# Patient Record
Sex: Male | Born: 1950 | Race: White | Hispanic: No | Marital: Married | Smoking: Never smoker
Health system: Southern US, Community
[De-identification: ages and names within clinical notes are randomized; demographics above are authoritative.]

## PROBLEM LIST (undated history)

## (undated) ENCOUNTER — Ambulatory Visit: Admission: EM | Payer: Medicare Other

## (undated) DIAGNOSIS — M109 Gout, unspecified: Secondary | ICD-10-CM

## (undated) DIAGNOSIS — M1990 Unspecified osteoarthritis, unspecified site: Secondary | ICD-10-CM

## (undated) DIAGNOSIS — I10 Essential (primary) hypertension: Secondary | ICD-10-CM

## (undated) DIAGNOSIS — G4730 Sleep apnea, unspecified: Secondary | ICD-10-CM

## (undated) HISTORY — PX: JOINT REPLACEMENT: SHX530

---

## 1999-05-27 DIAGNOSIS — I252 Old myocardial infarction: Secondary | ICD-10-CM

## 1999-05-27 HISTORY — PX: REPLACEMENT TOTAL KNEE: SUR1224

## 1999-05-27 HISTORY — DX: Old myocardial infarction: I25.2

## 2016-04-09 DIAGNOSIS — Z789 Other specified health status: Secondary | ICD-10-CM | POA: Insufficient documentation

## 2016-04-09 DIAGNOSIS — E785 Hyperlipidemia, unspecified: Secondary | ICD-10-CM | POA: Insufficient documentation

## 2016-04-09 DIAGNOSIS — G479 Sleep disorder, unspecified: Secondary | ICD-10-CM | POA: Insufficient documentation

## 2016-05-12 DIAGNOSIS — Z0000 Encounter for general adult medical examination without abnormal findings: Secondary | ICD-10-CM | POA: Insufficient documentation

## 2016-12-09 DIAGNOSIS — S82899B Other fracture of unspecified lower leg, initial encounter for open fracture type I or II: Secondary | ICD-10-CM | POA: Insufficient documentation

## 2016-12-09 DIAGNOSIS — R5383 Other fatigue: Secondary | ICD-10-CM | POA: Insufficient documentation

## 2017-01-08 DIAGNOSIS — E7800 Pure hypercholesterolemia, unspecified: Secondary | ICD-10-CM | POA: Insufficient documentation

## 2017-01-08 DIAGNOSIS — R001 Bradycardia, unspecified: Secondary | ICD-10-CM | POA: Insufficient documentation

## 2017-01-08 DIAGNOSIS — R011 Cardiac murmur, unspecified: Secondary | ICD-10-CM | POA: Insufficient documentation

## 2017-01-08 DIAGNOSIS — R079 Chest pain, unspecified: Secondary | ICD-10-CM | POA: Insufficient documentation

## 2017-01-08 DIAGNOSIS — I10 Essential (primary) hypertension: Secondary | ICD-10-CM | POA: Insufficient documentation

## 2020-02-16 ENCOUNTER — Ambulatory Visit (INDEPENDENT_AMBULATORY_CARE_PROVIDER_SITE_OTHER): Payer: Medicare Other

## 2020-02-16 ENCOUNTER — Ambulatory Visit
Admission: EM | Admit: 2020-02-16 | Discharge: 2020-02-16 | Disposition: A | Discharge status: Home / Self Care | Payer: Medicare Other

## 2020-02-16 ENCOUNTER — Encounter: Payer: Self-pay | Admitting: Emergency Medicine

## 2020-02-16 ENCOUNTER — Other Ambulatory Visit: Payer: Self-pay

## 2020-02-16 DIAGNOSIS — M25562 Pain in left knee: Secondary | ICD-10-CM | POA: Diagnosis not present

## 2020-02-16 DIAGNOSIS — S86912A Strain of unspecified muscle(s) and tendon(s) at lower leg level, left leg, initial encounter: Secondary | ICD-10-CM

## 2020-02-16 HISTORY — DX: Gout, unspecified: M10.9

## 2020-02-16 HISTORY — DX: Essential (primary) hypertension: I10

## 2020-02-16 NOTE — ED Provider Notes (Signed)
[facility]    CSN: [id number] Arrival date & time: 02/16/20  1443      History   Chief Complaint Chief Complaint  Patient presents with  . Knee Pain    left    HPI Keith Gray is a 69 y.o. male who presents with posterior medial knee pain which started yesterday. He was climbing up steps with items on his hands and when he took a step with his L foot as he lifted felt a pull and pin on area of pain. He iced it and went to bed. While in bed he felt pain when he changed positition from side to side, but ones comfortable, was able to go back to sleep. This am when he got up, he could hardly bare wt due to the pain, so has been using his crutches. Had TKA of the R knee, but his L knee has never been xrayed. Pivoting makes the pain worse as well.     Past Medical History:  Diagnosis Date  . Gout   . Hypertension     There are no problems to display for this patient.   Past Surgical History:  Procedure Laterality Date  . JOINT REPLACEMENT Right    knee       Home Medications    Prior to Admission medications   Medication Sig Start Date End Date Taking? Authorizing Provider  allopurinol (ZYLOPRIM) 300 MG tablet Take 300 mg by mouth daily. 12/22/19  Yes [provider]  aspirin 81 MG EC tablet ASPIRIN EC 81 MG TBEC 04/09/16  Yes [provider]  Cetirizine HCl 10 MG CAPS ZYRTEC ALLERGY 10 MG CAPS 04/09/16  Yes [provider]  losartan (COZAAR) 50 MG tablet Take 50 mg by mouth daily. 12/22/19  Yes [provider]  naproxen (NAPROSYN) 500 MG tablet Take 500 mg by mouth 2 (two) times daily. 11/15/19  Yes [provider]  pravastatin (PRAVACHOL) 40 MG tablet Take 40 mg by mouth daily. 12/22/19  Yes [provider]  traZODone (DESYREL) 50 MG tablet  02/13/20  Yes [provider]    Family History History reviewed. No pertinent family history.  Social History Social History   Tobacco Use  .  Smoking status: Never Smoker  . Smokeless tobacco: Never Used  Vaping Use  . Vaping Use: Never used  Substance Use Topics  . Alcohol use: Not Currently  . Drug use: Not Currently     Allergies   Iodine and Shellfish allergy   Review of Systems Review of Systems  Musculoskeletal: Positive for arthralgias and gait problem. Negative for joint swelling.  Skin: Negative for rash.  Neurological: Negative for numbness.     Physical Exam Triage Vital Signs ED Triage Vitals  Enc Vitals Group     BP 02/16/20 1532 (!) 142/80     Pulse Rate 02/16/20 1532 (!) 54     Resp 02/16/20 1532 18     Temp 02/16/20 1532 98.4 F (36.9 C)     Temp Source 02/16/20 1532 Oral     SpO2 02/16/20 1532 97 %     Weight 02/16/20 1527 250 lb (113.4 kg)     Height 02/16/20 1527 5\' 9"  (1.753 m)     Head Circumference --      Peak Flow --      Pain Score 02/16/20 1527 8     Pain Loc --      Pain Edu? --      Excl.  in GC? --    No data found.  Updated Vital Signs BP (!) 142/80 (BP Location: Right Arm)   Pulse (!) 54   Temp 98.4 F (36.9 C) (Oral)   Resp 18   Ht 5\' 9"  (1.753 m)   Wt 250 lb (113.4 kg)   SpO2 97%   BMI 36.92 kg/m   Visual Acuity Right Eye Distance:   Left Eye Distance:   Bilateral Distance:    Right Eye Near:   Left Eye Near:    Bilateral Near:     Physical Exam Vitals and nursing note reviewed.  Constitutional:      General: He is not in acute distress.    Appearance: He is obese. He is not toxic-appearing.  HENT:     Head: Normocephalic.     Right Ear: External ear normal.     Left Ear: External ear normal.  Eyes:     General: No scleral icterus.    Conjunctiva/sclera: Conjunctivae normal.  Pulmonary:     Effort: Pulmonary effort is normal.  Musculoskeletal:        General: No swelling or deformity.     Cervical back: Neck supple.     Comments: L KNEE- no swelling noted in the front or back of the knee. No ecchymosis or redness. Pain provoked mildly on area  of pain is provoked when testing lateral collateral ligament. No laxity noted.   Skin:    General: Skin is warm and dry.     Findings: No bruising or rash.  Neurological:     Mental Status: He is alert and oriented to person, place, and time.     Motor: No weakness.     Gait: Gait abnormal.  Psychiatric:        Mood and Affect: Mood normal.        Behavior: Behavior normal.        Thought Content: Thought content normal.        Judgment: Judgment normal.    UC Treatments / Results  Labs (all labs ordered are listed, but only abnormal results are displayed) Labs Reviewed - No data to display  EKG   Radiology No results found.  Procedures Procedures (including critical care time)  Medications Ordered in UC Medications - No data to display  Initial Impression / Assessment and Plan / UC Course  I have reviewed the triage vital signs and the nursing notes. He may have strained his meniscus vs tear. He was placed on a knee immobilizer and was able to bare weight better than when he arrived. He may take Advil or motrin prn pain and needs to FU with ortho. See instructions Pertinent  imaging results that were available during my care of the patient were reviewed by me and considered in my medical decision making (see chart for details).   Final Clinical Impressions(s) / UC Diagnoses   Final diagnoses:  None   Discharge Instructions   None    ED Prescriptions    None     PDMP not reviewed this encounter.   , Garey Ham 02/16/20 1703

## 2020-02-16 NOTE — ED Triage Notes (Signed)
Pt c/o left knee pain. Started last night after stepping wrong going up the stairs. He states he felt something pull. He has pain  On the backside of his knee.

## 2020-03-12 ENCOUNTER — Telehealth (INDEPENDENT_AMBULATORY_CARE_PROVIDER_SITE_OTHER): Payer: Self-pay | Admitting: Gastroenterology

## 2020-03-12 DIAGNOSIS — Z1211 Encounter for screening for malignant neoplasm of colon: Secondary | ICD-10-CM

## 2020-03-12 NOTE — Progress Notes (Signed)
Gastroenterology Pre-Procedure Review  Request Date: Friday 04/10/20 Requesting Physician: Dr. Servando Snare  Patient states that he prefers the Miralax, Gatorade, Dulcolax Prep for his colonoscopy.  He has been advised that this is not FDA approved for colonoscopies. Patient has decided to prep with this method due to intolerance of the other bowel preps.  PATIENT REVIEW QUESTIONS: The patient responded to the following health history questions as indicated:    1. Are you having any GI issues? no 2. Do you have a personal history of Polyps? no 3. Do you have a family history of Colon Cancer or Polyps? no 4. Diabetes Mellitus? no 5. Joint replacements in the past 12 months?no 6. Major health problems in the past 3 months?no 7. Any artificial heart valves, MVP, or defibrillator?no    MEDICATIONS & ALLERGIES:    Patient reports the following regarding taking any anticoagulation/antiplatelet therapy:   Plavix, Coumadin, Eliquis, Xarelto, Lovenox, Pradaxa, Brilinta, or Effient? no Aspirin? no  Patient confirms/reports the following medications:  Current Outpatient Medications  Medication Sig Dispense Refill  . allopurinol (ZYLOPRIM) 300 MG tablet Take 300 mg by mouth daily.    Marland Kitchen aspirin 81 MG EC tablet ASPIRIN EC 81 MG TBEC    . Cetirizine HCl 10 MG CAPS ZYRTEC ALLERGY 10 MG CAPS    . Glucosamine Sulfate 500 MG TABS GLUCOSAMINE SULFATE 500 MG TABS    . losartan (COZAAR) 50 MG tablet Take 50 mg by mouth daily.    . meloxicam (MOBIC) 15 MG tablet MOBIC 15 MG TABS    . naproxen (NAPROSYN) 500 MG tablet Take 500 mg by mouth 2 (two) times daily.    . pravastatin (PRAVACHOL) 40 MG tablet Take 40 mg by mouth daily.    . traZODone (DESYREL) 50 MG tablet     . zolpidem (AMBIEN) 10 MG tablet AMBIEN 10 MG TABS     No current facility-administered medications for this visit.    Patient confirms/reports the following allergies:  Allergies  Allergen Reactions  . Iodine Nausea And Vomiting  .  Shellfish Allergy Other (See Comments)    No orders of the defined types were placed in this encounter.   AUTHORIZATION INFORMATION Primary Insurance: 1D#: Group #:  Secondary Insurance: 1D#: Group #:  SCHEDULE INFORMATION: Date: Friday 04/13/20 Time: Location:[facility]

## 2020-04-03 ENCOUNTER — Other Ambulatory Visit: Payer: Self-pay

## 2020-04-03 NOTE — Progress Notes (Signed)
Procedure rescheduled and new instructions have been mailed out.

## 2020-04-25 DIAGNOSIS — U071 COVID-19: Secondary | ICD-10-CM

## 2020-04-25 HISTORY — DX: COVID-19: U07.1

## 2020-04-26 ENCOUNTER — Encounter: Payer: Self-pay | Admitting: Gastroenterology

## 2020-05-02 ENCOUNTER — Other Ambulatory Visit: Admission: RE | Admit: 2020-05-02 | Payer: Medicare Other | Source: Ambulatory Visit

## 2020-05-08 ENCOUNTER — Telehealth: Payer: Self-pay

## 2020-05-08 DIAGNOSIS — Z1211 Encounter for screening for malignant neoplasm of colon: Secondary | ICD-10-CM

## 2020-05-08 NOTE — Telephone Encounter (Signed)
Returned patients call to reschedule his colonoscopy from 06/11/20 [facility] with Dr. Servando Snare to another date.  LVM for him to call the office back to reschedule.  Thanks,  [place], [place]

## 2020-05-14 ENCOUNTER — Other Ambulatory Visit: Payer: Self-pay

## 2020-05-14 MED ORDER — NA SULFATE-K SULFATE-MG SULF 17.5-3.13-1.6 GM/177ML PO SOLN: 1.0000 | Freq: Once | ORAL | 0 refills | Status: AC

## 2020-05-14 NOTE — Progress Notes (Signed)
Patient requested to reschedule procedure. Updated instructions will be sent.

## 2020-06-07 ENCOUNTER — Other Ambulatory Visit: Payer: Self-pay

## 2020-06-07 ENCOUNTER — Encounter: Payer: Self-pay | Admitting: Gastroenterology

## 2020-06-13 ENCOUNTER — Other Ambulatory Visit: Admission: RE | Admit: 2020-06-13 | Payer: Medicare Other | Source: Ambulatory Visit

## 2020-06-14 NOTE — Discharge Instructions (Signed)

## 2020-06-15 ENCOUNTER — Ambulatory Visit: Payer: Medicare Other | Admitting: Anesthesiology

## 2020-06-15 ENCOUNTER — Other Ambulatory Visit: Payer: Self-pay

## 2020-06-15 ENCOUNTER — Ambulatory Visit
Admission: RE | Disposition: A | Discharge status: Home / Self Care | Payer: Self-pay | Source: Home / Self Care | Attending: Gastroenterology

## 2020-06-15 ENCOUNTER — Encounter: Payer: Self-pay | Admitting: Gastroenterology

## 2020-06-15 ENCOUNTER — Ambulatory Visit
Admission: RE | Admit: 2020-06-15 | Discharge: 2020-06-15 | Disposition: A | Discharge status: Home / Self Care | Payer: Medicare Other | Attending: Gastroenterology | Admitting: Gastroenterology

## 2020-06-15 DIAGNOSIS — Z7982 Long term (current) use of aspirin: Secondary | ICD-10-CM | POA: Insufficient documentation

## 2020-06-15 DIAGNOSIS — Z91013 Allergy to seafood: Secondary | ICD-10-CM | POA: Diagnosis not present

## 2020-06-15 DIAGNOSIS — K640 First degree hemorrhoids: Secondary | ICD-10-CM | POA: Diagnosis not present

## 2020-06-15 DIAGNOSIS — K5730 Diverticulosis of large intestine without perforation or abscess without bleeding: Secondary | ICD-10-CM | POA: Insufficient documentation

## 2020-06-15 DIAGNOSIS — Z96651 Presence of right artificial knee joint: Secondary | ICD-10-CM | POA: Insufficient documentation

## 2020-06-15 DIAGNOSIS — Z79899 Other long term (current) drug therapy: Secondary | ICD-10-CM | POA: Diagnosis not present

## 2020-06-15 DIAGNOSIS — Z1211 Encounter for screening for malignant neoplasm of colon: Secondary | ICD-10-CM | POA: Diagnosis not present

## 2020-06-15 DIAGNOSIS — Z791 Long term (current) use of non-steroidal anti-inflammatories (NSAID): Secondary | ICD-10-CM | POA: Insufficient documentation

## 2020-06-15 DIAGNOSIS — Z8616 Personal history of COVID-19: Secondary | ICD-10-CM | POA: Diagnosis not present

## 2020-06-15 DIAGNOSIS — Z888 Allergy status to other drugs, medicaments and biological substances status: Secondary | ICD-10-CM | POA: Insufficient documentation

## 2020-06-15 HISTORY — DX: Unspecified osteoarthritis, unspecified site: M19.90

## 2020-06-15 HISTORY — DX: Sleep apnea, unspecified: G47.30

## 2020-06-15 HISTORY — PX: COLONOSCOPY WITH PROPOFOL: SHX5780

## 2020-06-15 SURGERY — COLONOSCOPY WITH PROPOFOL
Anesthesia: General

## 2020-06-15 MED ORDER — LACTATED RINGERS IV SOLN: INTRAVENOUS | Status: DC | PRN

## 2020-06-15 MED ORDER — LACTATED RINGERS IV SOLN: INTRAVENOUS | Status: DC

## 2020-06-15 MED ORDER — STERILE WATER FOR IRRIGATION IR SOLN
Status: DC | PRN
  Administered 2020-06-15: 150 mL

## 2020-06-15 MED ORDER — LIDOCAINE HCL (CARDIAC) PF 100 MG/5ML IV SOSY
PREFILLED_SYRINGE | INTRAVENOUS | Status: DC | PRN
  Administered 2020-06-15: 50 mg via INTRAVENOUS

## 2020-06-15 MED ORDER — PROPOFOL 10 MG/ML IV BOLUS
INTRAVENOUS | Status: DC | PRN
  Administered 2020-06-15: 170 mg via INTRAVENOUS
  Administered 2020-06-15: 30 mg via INTRAVENOUS
  Administered 2020-06-15 (×2): 40 mg via INTRAVENOUS

## 2020-06-15 MED ORDER — SODIUM CHLORIDE 0.9 % IV SOLN: INTRAVENOUS | Status: DC

## 2020-06-15 SURGICAL SUPPLY — 25 items
CLIP HMST 235XBRD CATH ROT (MISCELLANEOUS) IMPLANT
CLIP RESOLUTION 360 11X235 (MISCELLANEOUS)
ELECT REM PT RETURN 9FT ADLT (ELECTROSURGICAL)
ELECTRODE REM PT RTRN 9FT ADLT (ELECTROSURGICAL) IMPLANT
FCP ESCP3.2XJMB 240X2.8X (MISCELLANEOUS)
FORCEPS BIOP RAD 4 LRG CAP 4 (CUTTING FORCEPS) IMPLANT
FORCEPS BIOP RJ4 240 W/NDL (MISCELLANEOUS)
FORCEPS ESCP3.2XJMB 240X2.8X (MISCELLANEOUS) IMPLANT
GOWN CVR UNV OPN BCK APRN NK (MISCELLANEOUS) ×2 IMPLANT
GOWN ISOL THUMB LOOP REG UNIV (MISCELLANEOUS) ×4
INJECTOR VARIJECT VIN23 (MISCELLANEOUS) IMPLANT
KIT DEFENDO VALVE AND CONN (KITS) IMPLANT
KIT PRC NS LF DISP ENDO (KITS) ×1 IMPLANT
KIT PROCEDURE OLYMPUS (KITS) ×2
MANIFOLD NEPTUNE II (INSTRUMENTS) ×2 IMPLANT
MARKER SPOT ENDO TATTOO 5ML (MISCELLANEOUS) IMPLANT
PROBE APC STR FIRE (PROBE) IMPLANT
RETRIEVER NET ROTH 2.5X230 LF (MISCELLANEOUS) IMPLANT
SNARE SHORT THROW 13M SML OVAL (MISCELLANEOUS) IMPLANT
SNARE SHORT THROW 30M LRG OVAL (MISCELLANEOUS) IMPLANT
SNARE SNG USE RND 15MM (INSTRUMENTS) IMPLANT
SPOT EX ENDOSCOPIC TATTOO (MISCELLANEOUS)
TRAP ETRAP POLY (MISCELLANEOUS) IMPLANT
VARIJECT INJECTOR VIN23 (MISCELLANEOUS)
WATER STERILE IRR 250ML POUR (IV SOLUTION) ×2 IMPLANT

## 2020-06-15 NOTE — Op Note (Signed)
[facility] Gastroenterology Patient Name: Keith Gray Procedure Date: 06/15/2020 9:00 AM MRN: [id number] Account #: [id number] Date of Birth: [birth year] Admit Type: Outpatient Age: 70 Room: [facility] OR ROOM 01 Gender: Male Note Status: Finalized Procedure:             Colonoscopy Indications:           Screening for colorectal malignant neoplasm Providers:             [name] MD, MD Referring MD:          Presley Raddle Revelo (Referring MD) Medicines:             Propofol per Anesthesia Complications:         No immediate complications. Procedure:             Pre-Anesthesia Assessment:                        - Prior to the procedure, a History and Physical was                         performed, and patient medications and allergies were                         reviewed. The patient's tolerance of previous                         anesthesia was also reviewed. The risks and benefits                         of the procedure and the sedation options and risks                         were discussed with the patient. All questions were                         answered, and informed consent was obtained. Prior                         Anticoagulants: The patient has taken no previous                         anticoagulant or antiplatelet agents. ASA Grade                         Assessment: II - A patient with mild systemic disease.                         After reviewing the risks and benefits, the patient                         was deemed in satisfactory condition to undergo the                         procedure.                        After obtaining informed consent, the colonoscope was  passed under direct vision. Throughout the procedure,                         the patient's blood pressure, pulse, and oxygen                         saturations were monitored continuously. The                         Colonoscope was introduced through  the anus and                         advanced to the the cecum, identified by appendiceal                         orifice and ileocecal valve. The colonoscopy was                         performed without difficulty. The patient tolerated                         the procedure well. The quality of the bowel                         preparation was excellent. Findings:      The perianal and digital rectal examinations were normal.      Multiple small-mouthed diverticula were found in the sigmoid colon.      Non-bleeding internal hemorrhoids were found during retroflexion. The       hemorrhoids were Grade II (internal hemorrhoids that prolapse but reduce       spontaneously). Impression:            - Diverticulosis in the sigmoid colon.                        - Non-bleeding internal hemorrhoids.                        - No specimens collected. Recommendation:        - Discharge patient to home.                        - Resume previous diet.                        - Continue present medications.                        - No repeat colonoscopy due to current age (24 years                         or older).                        - unless any change in family history or lower GI                         problems. Procedure Code(s):     --- Professional ---  38177, Colonoscopy, flexible; diagnostic, including                         collection of specimen(s) by brushing or washing, when                         performed (separate procedure) Diagnosis Code(s):     --- Professional ---                        Z12.11, Encounter for screening for malignant neoplasm                         of colon CPT copyright 2019 American Medical Association. All rights reserved. The codes documented in this report are preliminary and upon coder review may  be revised to meet current compliance requirements. [name] MD, MD 06/15/2020 9:22:06 AM This report has been signed  electronically. Number of Addenda: 0 Note Initiated On: 06/15/2020 9:00 AM Scope Withdrawal Time: 0 hours 7 minutes 13 seconds  Total Procedure Duration: 0 hours 11 minutes 4 seconds  Estimated Blood Loss:  Estimated blood loss: none.      [facility]

## 2020-06-15 NOTE — Anesthesia Preprocedure Evaluation (Signed)
Anesthesia Evaluation  Patient identified by MRN, date of birth, ID band Patient awake    Reviewed: Allergy & Precautions, H&P , NPO status , Patient's Chart, lab work & pertinent test results  Airway Mallampati: II  TM Distance: >3 FB Neck ROM: full    Dental  (+) Upper Dentures   Pulmonary sleep apnea and Continuous Positive Airway Pressure Ventilation ,    Pulmonary exam normal breath sounds clear to auscultation       Cardiovascular hypertension, + Past MI  Normal cardiovascular exam Rhythm:regular Rate:Normal     Neuro/Psych    GI/Hepatic   Endo/Other    Renal/GU      Musculoskeletal   Abdominal   Peds  Hematology   Anesthesia Other Findings   Reproductive/Obstetrics                             Anesthesia Physical Anesthesia Plan  ASA: III  Anesthesia Plan: General   Post-op Pain Management:    Induction: Intravenous  PONV Risk Score and Plan: 2 and Treatment may vary due to age or medical condition, TIVA and Propofol infusion  Airway Management Planned: Natural Airway  Additional Equipment:   Intra-op Plan:   Post-operative Plan:   Informed Consent: I have reviewed the patients History and Physical, chart, labs and discussed the procedure including the risks, benefits and alternatives for the proposed anesthesia with the patient or authorized representative who has indicated his/her understanding and acceptance.     Dental Advisory Given  Plan Discussed with: CRNA  Anesthesia Plan Comments:         Anesthesia Quick Evaluation

## 2020-06-15 NOTE — H&P (Signed)
[name], MD [facility] [address], [address] [place], [place] [address] Phone: [phone] Fax : [phone]  Primary Care Physician:  Preston Fleeting, MD Primary Gastroenterologist:  Dr. Servando Snare  Pre-Procedure History & Physical: HPI:  Keith Gray is a 70 y.o. male is here for a screening colonoscopy.   Past Medical History:  Diagnosis Date  . Arthritis    left knee; left shoulder  . COVID-19 04/25/2020  . Gout   . Hypertension   . Myocardial infarct, old 2001  . Sleep apnea     Past Surgical History:  Procedure Laterality Date  . JOINT REPLACEMENT Right    knee  . REPLACEMENT TOTAL KNEE Right 2001    Prior to Admission medications   Medication Sig Start Date End Date Taking? Authorizing Provider  allopurinol (ZYLOPRIM) 300 MG tablet Take 300 mg by mouth daily. 12/22/19  Yes [provider]  aspirin 81 MG EC tablet ASPIRIN EC 81 MG TBEC 04/09/16  Yes [provider]  Cetirizine HCl 10 MG CAPS ZYRTEC ALLERGY 10 MG CAPS 04/09/16  Yes [provider]  Famotidine (PEPCID PO) Take by mouth daily.   Yes [provider]  Glucosamine Sulfate 500 MG TABS GLUCOSAMINE SULFATE 500 MG TABS 04/09/16  Yes [provider]  losartan (COZAAR) 50 MG tablet Take 50 mg by mouth daily. 12/22/19  Yes [provider]  naproxen (NAPROSYN) 500 MG tablet Take 500 mg by mouth 2 (two) times daily. 11/15/19  Yes [provider]  Omega-3 Fatty Acids (FISH OIL PO) Take by mouth daily.   Yes [provider]  pravastatin (PRAVACHOL) 40 MG tablet Take 40 mg by mouth daily. 12/22/19  Yes [provider]  traZODone (DESYREL) 50 MG tablet  02/13/20  Yes [provider]  VITAMIN D PO Take by mouth daily.   Yes [provider]    Allergies as of 03/12/2020 - Review Complete 03/12/2020  Allergen Reaction Noted  . Iodine Nausea And Vomiting 05/12/2016  . Shellfish allergy Other (See Comments) 05/12/2016     History reviewed. No pertinent family history.  Social History   Socioeconomic History  . Marital status: Married    Spouse name: Not on file  . Number of children: Not on file  . Years of education: Not on file  . Highest education level: Not on file  Occupational History  . Not on file  Tobacco Use  . Smoking status: Never Smoker  . Smokeless tobacco: Never Used  Vaping Use  . Vaping Use: Never used  Substance and Sexual Activity  . Alcohol use: Not Currently  . Drug use: Not Currently  . Sexual activity: Not on file  Other Topics Concern  . Not on file  Social History Narrative  . Not on file   Social Determinants of Health   Financial Resource Strain: Not on file  Food Insecurity: Not on file  Transportation Needs: Not on file  Physical Activity: Not on file  Stress: Not on file  Social Connections: Not on file  Intimate Partner Violence: Not on file    Review of Systems: See HPI, otherwise negative ROS  Physical Exam: BP (!) 140/101   Pulse 68   Temp (!) 97 F (36.1 C) (Temporal)   Ht 5\' 9"  (1.753 m)   Wt 117 kg   SpO2 97%   BMI 38.10 kg/m  General:   Alert,  pleasant and cooperative in NAD Head:  Normocephalic and atraumatic. Neck:  Supple; no  masses or thyromegaly. Lungs:  Clear throughout to auscultation.    Heart:  Regular rate and rhythm. Abdomen:  Soft, nontender and nondistended. Normal bowel sounds, without guarding, and without rebound.   Neurologic:  Alert and  oriented x4;  grossly normal neurologically.  Impression/Plan: [name] is now here to undergo a screening colonoscopy.  Risks, benefits, and alternatives regarding colonoscopy have been reviewed with the patient.  Questions have been answered.  All parties agreeable.

## 2020-06-15 NOTE — Anesthesia Postprocedure Evaluation (Signed)
Anesthesia Post Note  Patient: Keith Gray  Procedure(s) Performed: COLONOSCOPY WITH PROPOFOL (N/A )     Patient location during evaluation: PACU Anesthesia Type: General Level of consciousness: awake and alert and oriented Pain management: satisfactory to patient Vital Signs Assessment: post-procedure vital signs reviewed and stable Respiratory status: spontaneous breathing, nonlabored ventilation and respiratory function stable Cardiovascular status: blood pressure returned to baseline and stable Postop Assessment: Adequate PO intake and No signs of nausea or vomiting Anesthetic complications: no   No complications documented.  [place]

## 2020-06-15 NOTE — Transfer of Care (Signed)
Immediate Anesthesia Transfer of Care Note  Patient: Keith Gray  Procedure(s) Performed: COLONOSCOPY WITH PROPOFOL (N/A )  Patient Location: PACU  Anesthesia Type: General  Level of Consciousness: awake, alert  and patient cooperative  Airway and Oxygen Therapy: Patient Spontanous Breathing and Patient connected to supplemental oxygen  Post-op Assessment: Post-op Vital signs reviewed, Patient's Cardiovascular Status Stable, Respiratory Function Stable, Patent Airway and No signs of Nausea or vomiting  Post-op Vital Signs: Reviewed and stable  Complications: No complications documented.

## 2020-06-15 NOTE — Anesthesia Procedure Notes (Signed)
Date/Time: 06/15/2020 9:06 AM Performed by: Maree Krabbe, CRNA Pre-anesthesia Checklist: Patient identified, Emergency Drugs available, Suction available, Timeout performed and Patient being monitored Patient Re-evaluated:Patient Re-evaluated prior to induction Oxygen Delivery Method: Nasal cannula Placement Confirmation: positive ETCO2

## 2020-06-18 ENCOUNTER — Encounter: Payer: Self-pay | Admitting: Gastroenterology

## 2020-06-27 ENCOUNTER — Ambulatory Visit: Payer: Medicare Other | Admitting: Dermatology

## 2020-08-16 ENCOUNTER — Ambulatory Visit: Payer: Medicare Other | Admitting: Dermatology

## 2020-11-08 ENCOUNTER — Emergency Department
Admission: EM | Admit: 2020-11-08 | Discharge: 2020-11-08 | Disposition: A | Discharge status: Home / Self Care | Payer: Medicare Other | Attending: Emergency Medicine | Admitting: Emergency Medicine

## 2020-11-08 ENCOUNTER — Other Ambulatory Visit: Payer: Self-pay

## 2020-11-08 DIAGNOSIS — I10 Essential (primary) hypertension: Secondary | ICD-10-CM | POA: Diagnosis not present

## 2020-11-08 DIAGNOSIS — S41151A Open bite of right upper arm, initial encounter: Secondary | ICD-10-CM | POA: Diagnosis present

## 2020-11-08 DIAGNOSIS — Z79899 Other long term (current) drug therapy: Secondary | ICD-10-CM | POA: Diagnosis not present

## 2020-11-08 DIAGNOSIS — Z8616 Personal history of COVID-19: Secondary | ICD-10-CM | POA: Insufficient documentation

## 2020-11-08 DIAGNOSIS — Z96651 Presence of right artificial knee joint: Secondary | ICD-10-CM | POA: Insufficient documentation

## 2020-11-08 DIAGNOSIS — W540XXA Bitten by dog, initial encounter: Secondary | ICD-10-CM | POA: Insufficient documentation

## 2020-11-08 DIAGNOSIS — Z7982 Long term (current) use of aspirin: Secondary | ICD-10-CM | POA: Diagnosis not present

## 2020-11-08 MED ORDER — TRAMADOL HCL 50 MG PO TABS
50.0000 mg | ORAL_TABLET | Freq: Once | ORAL | Status: AC
  Administered 2020-11-08: 50 mg via ORAL
  Filled 2020-11-08: qty 1

## 2020-11-08 MED ORDER — AMOXICILLIN-POT CLAVULANATE 875-125 MG PO TABS: 1.0000 | ORAL_TABLET | Freq: Two times a day (BID) | ORAL | 0 refills | Status: AC

## 2020-11-08 MED ORDER — NAPROXEN 500 MG PO TABS
500.0000 mg | ORAL_TABLET | Freq: Once | ORAL | Status: AC
  Administered 2020-11-08: 500 mg via ORAL
  Filled 2020-11-08: qty 1

## 2020-11-08 MED ORDER — NAPROXEN 500 MG PO TABS: 500.0000 mg | ORAL_TABLET | Freq: Two times a day (BID) | ORAL | Status: AC

## 2020-11-08 MED ORDER — AMOXICILLIN-POT CLAVULANATE 875-125 MG PO TABS
1.0000 | ORAL_TABLET | Freq: Once | ORAL | Status: AC
  Administered 2020-11-08: 1 via ORAL
  Filled 2020-11-08: qty 1

## 2020-11-08 NOTE — ED Notes (Signed)
This RN spoke with Sgt F[place]e [place] [employer]

## 2020-11-08 NOTE — ED Provider Notes (Signed)
[facility] Emergency Department Provider Note   ____________________________________________   Event Date/Time   First MD Initiated Contact with Patient 11/08/20 1355     (approximate)  I have reviewed the triage vital signs and the nursing notes.   HISTORY  Chief Complaint Animal Bite    HPI Keith Gray is a 70 y.o. male patient presents with dog bite to the right upper arm.  Wounds are superficial.  Patient states wife is verifying immunization status of the animal.  Patient tetanus shot is up-to-date.  Rates pain as a 5/10.  Described pain as "sore".  No palliative measure prior to arrival.     Past Medical History:  Diagnosis Date   Arthritis    left knee; left shoulder   COVID-19 04/25/2020   Gout    Hypertension    Myocardial infarct, old 2001   Sleep apnea     Patient Active Problem List   Diagnosis Date Noted   Encounter for screening colonoscopy    Bradycardia 01/08/2017   Chest pain with high risk for cardiac etiology 01/08/2017   Essential hypertension 01/08/2017   Heart murmur, systolic 01/08/2017   Pure hypercholesterolemia 01/08/2017   Fatigue 12/09/2016   Open fracture of ankle 12/09/2016   Encounter for general adult medical examination without abnormal findings 05/12/2016   Hyperlipidemia 04/09/2016   Other specified health status 04/09/2016   Sleep disorder 04/09/2016    Past Surgical History:  Procedure Laterality Date   COLONOSCOPY WITH PROPOFOL N/A 06/15/2020   Procedure: COLONOSCOPY WITH PROPOFOL;  Surgeon: [name], MD;  Location: [facility];  Service: Endoscopy;  Laterality: N/A;  COVID (+) 12/1-21 priority 4   JOINT REPLACEMENT Right    knee   REPLACEMENT TOTAL KNEE Right 2001    Prior to Admission medications   Medication Sig Start Date End Date Taking? Authorizing Provider  amoxicillin-clavulanate (AUGMENTIN) 875-125 MG tablet Take 1 tablet by mouth 2 (two) times daily for 10 days.  11/08/20 11/18/20 Yes Joni Reining, PA-C  naproxen (NAPROSYN) 500 MG tablet Take 1 tablet (500 mg total) by mouth 2 (two) times daily with a meal. 11/08/20  Yes Joni Reining, PA-C  allopurinol (ZYLOPRIM) 300 MG tablet Take 300 mg by mouth daily. 12/22/19   [provider]  aspirin 81 MG EC tablet ASPIRIN EC 81 MG TBEC 04/09/16   [provider]  Cetirizine HCl 10 MG CAPS ZYRTEC ALLERGY 10 MG CAPS 04/09/16   [provider]  Famotidine (PEPCID PO) Take by mouth daily.    [provider]  Glucosamine Sulfate 500 MG TABS GLUCOSAMINE SULFATE 500 MG TABS 04/09/16   [provider]  losartan (COZAAR) 50 MG tablet Take 50 mg by mouth daily. 12/22/19   [provider]  naproxen (NAPROSYN) 500 MG tablet Take 500 mg by mouth 2 (two) times daily. 11/15/19   [provider]  Omega-3 Fatty Acids (FISH OIL PO) Take by mouth daily.    [provider]  pravastatin (PRAVACHOL) 40 MG tablet Take 40 mg by mouth daily. 12/22/19   [provider]  traZODone (DESYREL) 50 MG tablet  02/13/20   [provider]  VITAMIN D PO Take by mouth daily.    [provider]    Allergies Iodine and Shellfish allergy  No family history on file.  Social History Social History   Tobacco Use   Smoking status: Never   Smokeless tobacco: Never  Vaping Use   Vaping Use:  Never used  Substance Use Topics   Alcohol use: Not Currently   Drug use: Not Currently    Review of Systems  Constitutional: No fever/chills Eyes: No visual changes. ENT: No sore throat. Cardiovascular: Denies chest pain. Respiratory: Denies shortness of breath. Gastrointestinal: No abdominal pain.  No nausea, no vomiting.  No diarrhea.  No constipation. Genitourinary: Negative for dysuria. Musculoskeletal: Negative for back pain. Skin: Negative for rash. Neurological: Negative for headaches, focal weakness or numbness. Endocrine: Gout, hyperlipidemia,  and hypertension. Hematological/Lymphatic:  Allergic/Immunilogical: Lodine and shellfish  ____________________________________________   PHYSICAL EXAM:  VITAL SIGNS: ED Triage Vitals [11/08/20 1329]  Enc Vitals Group     BP (!) 156/98     Pulse Rate 62     Resp 18     Temp 98.2 F (36.8 C)     Temp Source Oral     SpO2 95 %     Weight 260 lb (117.9 kg)     Height 5\' 9"  (1.753 m)     Head Circumference      Peak Flow      Pain Score 5     Pain Loc      Pain Edu?      Excl. in GC?     Constitutional: Alert and oriented. Well appearing and in no acute distress. Cardiovascular: Normal rate, regular rhythm. Grossly normal heart sounds.  Good peripheral circulation.  Elevated blood pressure. Respiratory: Normal respiratory effort.  No retractions. Lungs CTAB. Musculoskeletal: No lower extremity tenderness nor edema.  No joint effusions. Neurologic:  Normal speech and language. No gross focal neurologic deficits are appreciated. No gait instability. Skin: Facial dog bite she is the right upper arm.   Psychiatric: Mood and affect are normal. Speech and behavior are normal.  ____________________________________________   LABS (all labs ordered are listed, but only abnormal results are displayed)  Labs Reviewed - No data to display ____________________________________________  EKG   ____________________________________________  RADIOLOGY I, , personally viewed and evaluated these images (plain radiographs) as part of my medical decision making, as well as reviewing the written report by the radiologist.  ED MD interpretation:    Official radiology report(s): No results found.  ____________________________________________   PROCEDURES  Procedure(s) performed (including Critical Care):  Procedures   ____________________________________________   INITIAL IMPRESSION / ASSESSMENT AND PLAN / ED COURSE  As part of my medical decision making, I  reviewed the following data within the electronic MEDICAL RECORD NUMBER         Patient presents with superficial dog bite to the right upper arm.  Area was cleaned and bandaged.  Patient given discharge care instruction.  Patient given a prescription for Augmentin and naproxen.  We will follow-up pending immunization status of dog.      ____________________________________________   FINAL CLINICAL IMPRESSION(S) / ED DIAGNOSES  Final diagnoses:  Dog bite, initial encounter     ED Discharge Orders          Ordered    amoxicillin-clavulanate (AUGMENTIN) 875-125 MG tablet  2 times daily        11/08/20 1419    naproxen (NAPROSYN) 500 MG tablet  2 times daily with meals        11/08/20 1419             Note:  This document was prepared using Dragon voice recognition software and may include unintentional dictation errors.    11/10/20, PA-C 11/08/20 1422    11/10/20,  Leonette Most, MD 11/08/20 1529

## 2020-11-08 NOTE — ED Triage Notes (Signed)
Pt to ED for dog bite today. Bite mark noted to right upper arm. Bleeding controlled Unsure of last tetanus States he believes dogs shots were UTD, waiting on wife to get owner info

## 2020-11-08 NOTE — ED Notes (Signed)
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

## 2020-11-08 NOTE — Discharge Instructions (Addendum)
Read and follow discharge care instruction.  Verify immunization status of dog.  Return brain to ED if rabies shots are needed.

## 2021-06-20 ENCOUNTER — Other Ambulatory Visit: Payer: Self-pay | Admitting: Physician Assistant

## 2021-07-05 IMAGING — CR DG KNEE COMPLETE 4+V*L*
4 series · 4 of 4 positions shown · non-contrast
Comparison: None.

CLINICAL DATA: Posteromedial knee pain.

EXAM:
LEFT KNEE - COMPLETE 4+ VIEW

[knee ap]
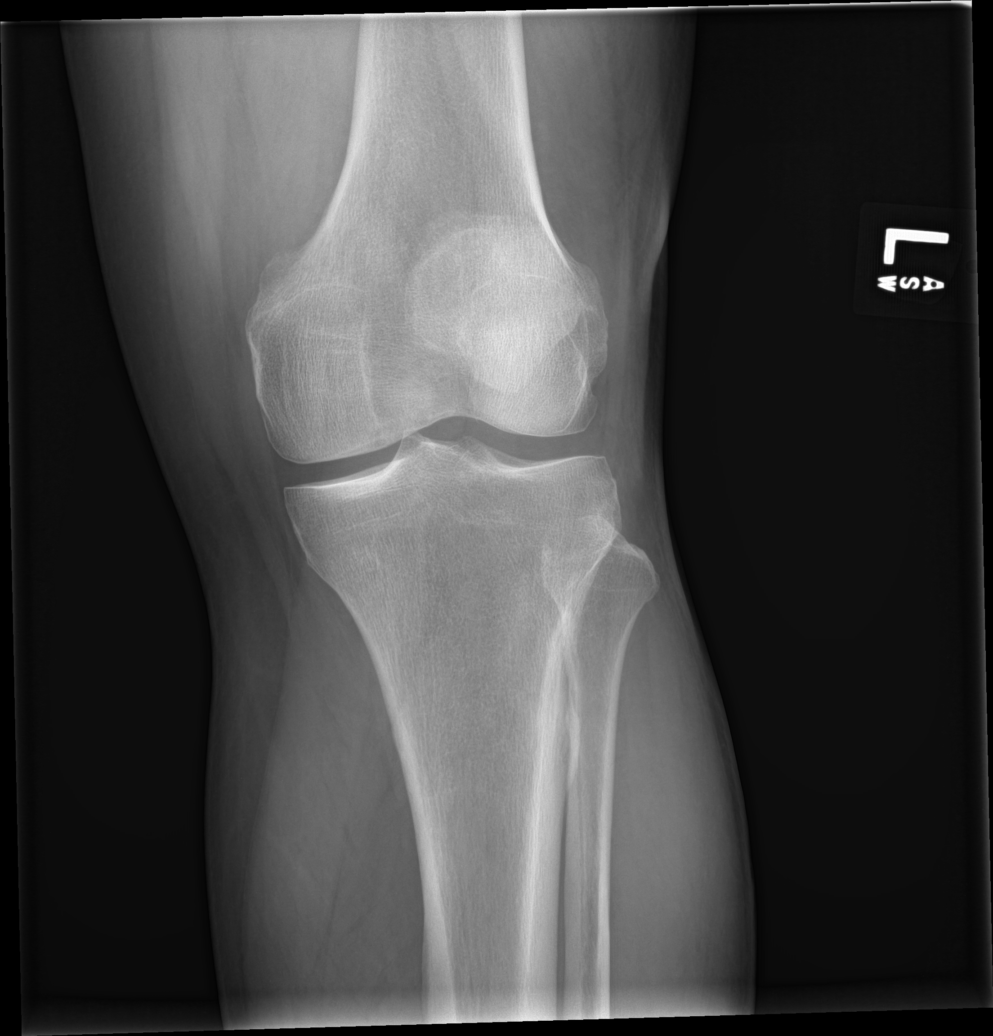

[knee lat]
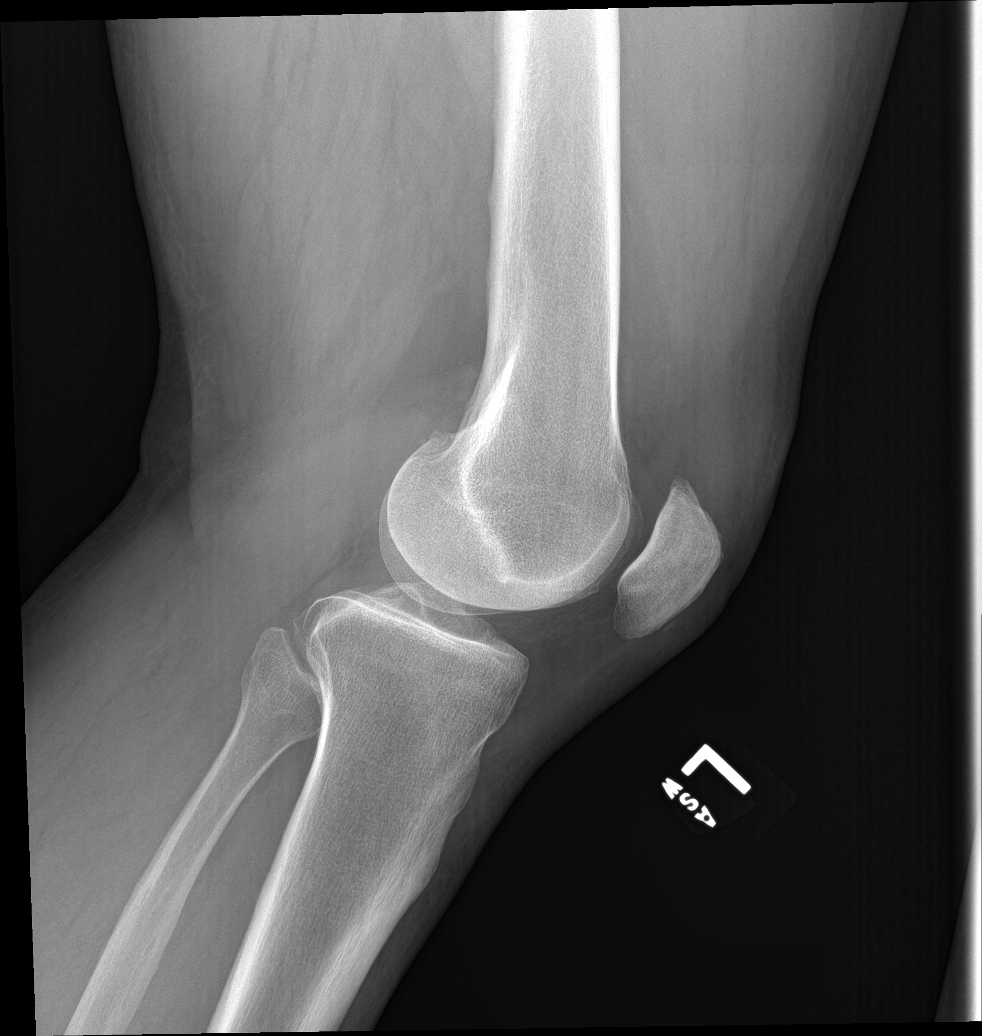

[knee obl (1 of 2)]
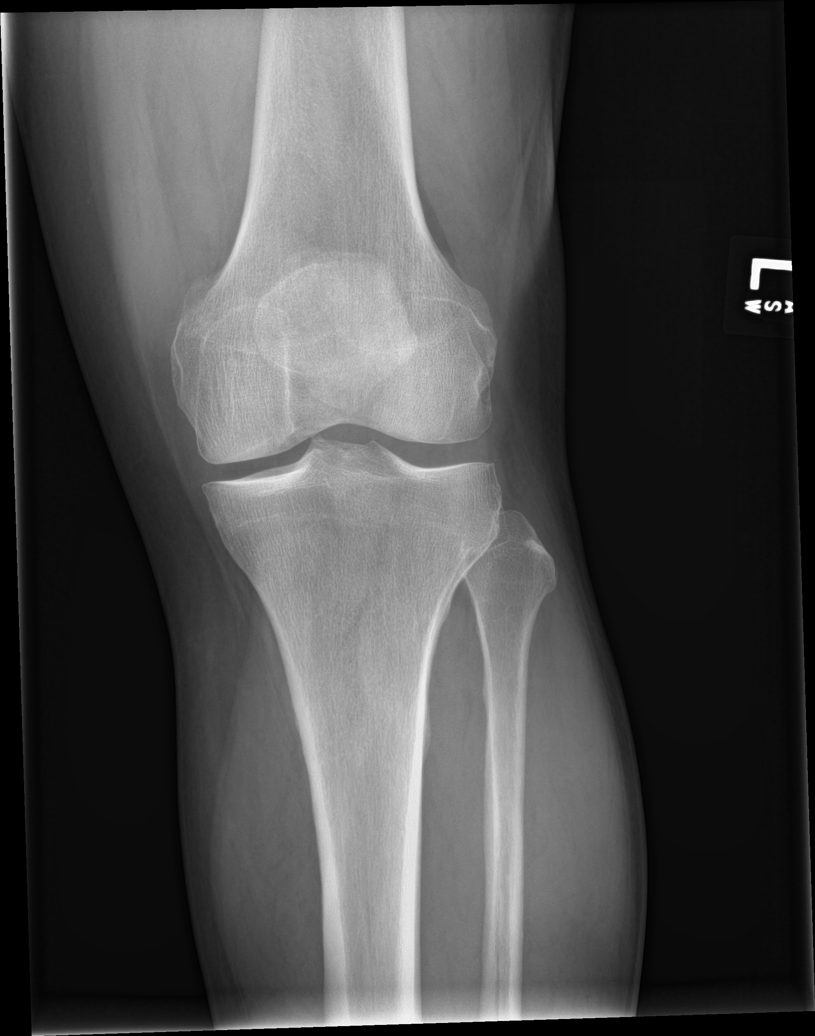

[knee obl (2 of 2)]
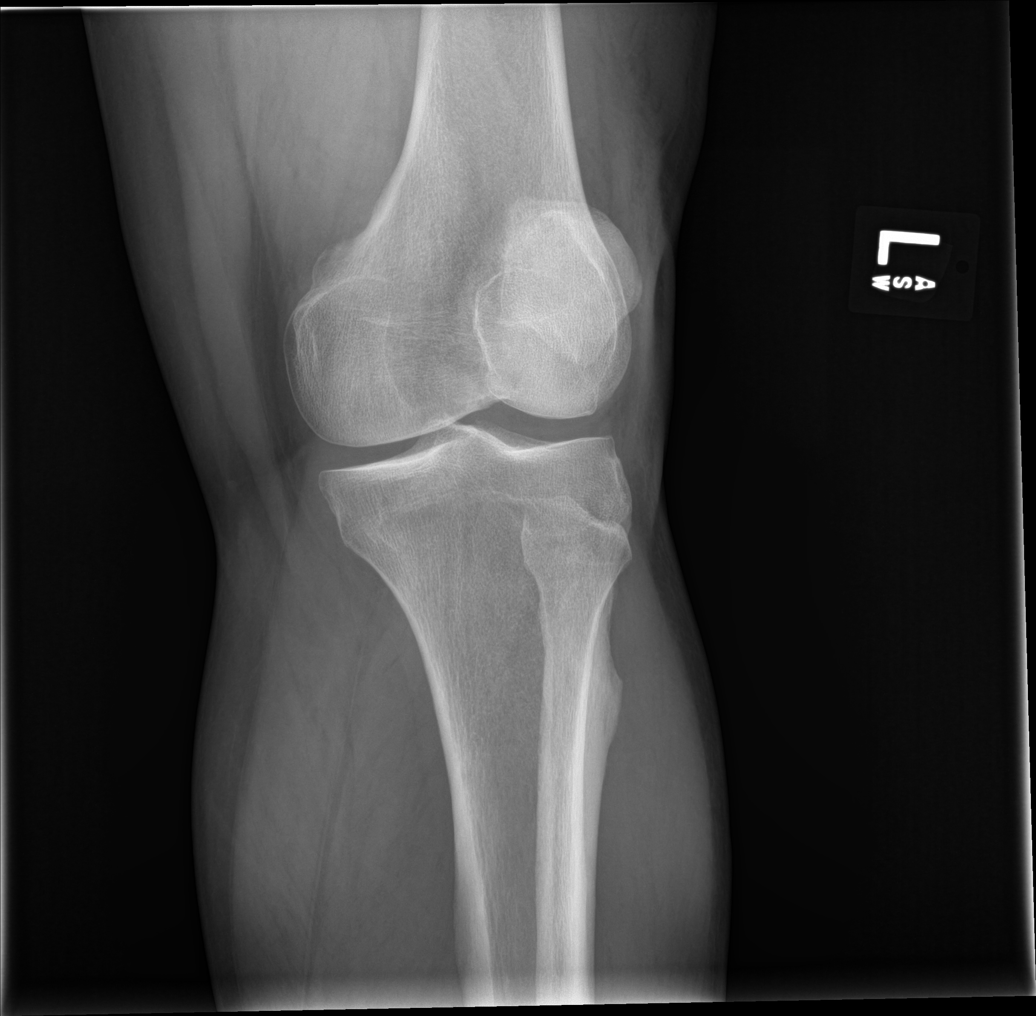

[4 of 4 positions shown; findings below may reference images not displayed]

FINDINGS: No evidence of fracture, dislocation, or joint effusion. No evidence
of arthropathy or other focal bone abnormality. Soft tissues are
unremarkable.
IMPRESSION: Negative.

## 2022-01-30 ENCOUNTER — Other Ambulatory Visit: Payer: Self-pay | Admitting: Student

## 2022-01-30 DIAGNOSIS — M25562 Pain in left knee: Secondary | ICD-10-CM

## 2023-12-16 ENCOUNTER — Ambulatory Visit
Admission: EM | Admit: 2023-12-16 | Discharge: 2023-12-16 | Disposition: A | Discharge status: Home / Self Care | Attending: Family Medicine | Admitting: Family Medicine

## 2023-12-16 ENCOUNTER — Encounter: Payer: Self-pay | Admitting: Emergency Medicine

## 2023-12-16 DIAGNOSIS — H5789 Other specified disorders of eye and adnexa: Secondary | ICD-10-CM | POA: Diagnosis not present

## 2023-12-16 DIAGNOSIS — T63461A Toxic effect of venom of wasps, accidental (unintentional), initial encounter: Secondary | ICD-10-CM | POA: Diagnosis not present

## 2023-12-16 DIAGNOSIS — R220 Localized swelling, mass and lump, head: Secondary | ICD-10-CM | POA: Diagnosis not present

## 2023-12-16 MED ORDER — DEXAMETHASONE SODIUM PHOSPHATE 10 MG/ML IJ SOLN
10.0000 mg | Freq: Once | INTRAMUSCULAR | Status: AC
  Administered 2023-12-16: 10 mg via INTRAMUSCULAR

## 2023-12-16 MED ORDER — PREDNISONE 10 MG (21) PO TBPK: ORAL_TABLET | Freq: Every day | ORAL | 0 refills | Status: DC

## 2023-12-16 MED ORDER — DIPHENHYDRAMINE HCL 25 MG PO CAPS
25.0000 mg | ORAL_CAPSULE | Freq: Once | ORAL | Status: AC
  Administered 2023-12-16: 25 mg via ORAL

## 2023-12-16 MED ORDER — TRIAMCINOLONE ACETONIDE 0.1 % EX OINT: 1.0000 | TOPICAL_OINTMENT | Freq: Two times a day (BID) | CUTANEOUS | 0 refills | Status: DC

## 2023-12-16 MED ORDER — CETIRIZINE HCL 10 MG PO TABS: 10.0000 mg | ORAL_TABLET | Freq: Two times a day (BID) | ORAL | Status: AC | PRN

## 2023-12-16 NOTE — ED Provider Notes (Incomplete)
 [facility]    CSN: [id number] Arrival date & time: 12/16/23  1910      History   Chief Complaint Chief Complaint  Patient presents with   Insect Bite    HPI Keith Gray is a 73 y.o. male.   HPI  Keith Gray presents after a wasp sting after weed whacking outside 3 hours ago. There were wasps on the birdhouse and the vibration made them come out vengeful.   He was stung on the left side of his face by his eye. His wife put ice on it and gave him some ibuprofen.  The swelling kept getting worse so his wife told him to go to the urgent care.    No rash, throat tightness, shortness of breath, chest pain, palpitations, vomiting, belly pain, headache, diarrhea or cyanosis.     Past Medical History:  Diagnosis Date   Arthritis    left knee; left shoulder   COVID-19 04/25/2020   Gout    Hypertension    Myocardial infarct, old 2001   Sleep apnea     Patient Active Problem List   Diagnosis Date Noted   Encounter for screening colonoscopy    Bradycardia 01/08/2017   Chest pain with high risk for cardiac etiology 01/08/2017   Essential hypertension 01/08/2017   Heart murmur, systolic 01/08/2017   Pure hypercholesterolemia 01/08/2017   Fatigue 12/09/2016   Open fracture of ankle 12/09/2016   Encounter for general adult medical examination without abnormal findings 05/12/2016   Hyperlipidemia 04/09/2016   Other specified health status 04/09/2016   Sleep disorder 04/09/2016    Past Surgical History:  Procedure Laterality Date   COLONOSCOPY WITH PROPOFOL  N/A 06/15/2020   Procedure: COLONOSCOPY WITH PROPOFOL ;  Surgeon: Jinny Carmine, MD;  Location: [facility];  Service: Endoscopy;  Laterality: N/A;  COVID (+) 12/1-21 priority 4   JOINT REPLACEMENT Right    knee   REPLACEMENT TOTAL KNEE Right 2001       Home Medications    Prior to Admission medications   Medication Sig Start Date End Date Taking? Authorizing Provider  cetirizine  (ZYRTEC ) 10  MG tablet Take 1 tablet (10 mg total) by mouth 2 (two) times daily as needed for allergies. 12/16/23  Yes Gail Creekmore, DO  predniSONE  (STERAPRED UNI-PAK 21 TAB) 10 MG (21) TBPK tablet Take by mouth daily. Take 6 tabs by mouth daily for 1, then 5 tabs for 1 day, then 4 tabs for 1 day, then 3 tabs for 1 day, then 2 tabs for 1 day, then 1 tab for 1 day. 12/17/23  Yes Midge Momon, DO  triamcinolone  ointment (KENALOG ) 0.1 % Apply 1 Application topically 2 (two) times daily. 12/16/23  Yes Denea Cheaney, DO  allopurinol (ZYLOPRIM) 300 MG tablet Take 300 mg by mouth daily. 12/22/19   [provider]  aspirin 81 MG EC tablet ASPIRIN EC 81 MG TBEC 04/09/16   [provider]  Famotidine (PEPCID PO) Take by mouth daily.    [provider]  Glucosamine Sulfate 500 MG TABS GLUCOSAMINE SULFATE 500 MG TABS 04/09/16   [provider]  losartan (COZAAR) 50 MG tablet Take 50 mg by mouth daily. 12/22/19   [provider]  naproxen  (NAPROSYN ) 500 MG tablet Take 500 mg by mouth 2 (two) times daily. 11/15/19   [provider]  naproxen  (NAPROSYN ) 500 MG tablet Take 1 tablet (500 mg total) by mouth 2 (two) times daily with a meal. 11/08/20   Claudene Tanda POUR, PA-C  Omega-3 Fatty Acids (FISH OIL PO) Take by mouth daily.    [provider]  pravastatin (PRAVACHOL) 40 MG tablet Take 40 mg by mouth daily. 12/22/19   [provider]  traZODone (DESYREL) 50 MG tablet  02/13/20   [provider]  VITAMIN D PO Take by mouth daily.    [provider]    Family History No family history on file.  Social History Social History   Tobacco Use   Smoking status: Never   Smokeless tobacco: Never  Vaping Use   Vaping status: Never Used  Substance Use Topics   Alcohol use: Not Currently   Drug use: Not Currently     Allergies   Iodine, Shellfish allergy, and Shellfish-derived products   Review of Systems Review of Systems :negative  unless otherwise stated in HPI.      Physical Exam Triage Vital Signs ED Triage Vitals  Encounter Vitals Group     BP 12/16/23 1933 (!) 140/90     Girls Systolic BP Percentile --      Girls Diastolic BP Percentile --      Boys Systolic BP Percentile --      Boys Diastolic BP Percentile --      Pulse Rate 12/16/23 1933 (!) 54     Resp 12/16/23 1933 16     Temp 12/16/23 1933 98.6 F (37 C)     Temp Source 12/16/23 1933 Oral     SpO2 12/16/23 1933 97 %     Weight --      Height --      Head Circumference --      Peak Flow --      Pain Score 12/16/23 1930 3     Pain Loc --      Pain Education --      Exclude from Growth Chart --    No data found.  Updated Vital Signs BP (!) 140/90 (BP Location: Left Arm)   Pulse (!) 54   Temp 98.6 F (37 C) (Oral)   Resp 16   SpO2 97%   Visual Acuity Right Eye Distance:   Left Eye Distance:   Bilateral Distance:    Right Eye Near:   Left Eye Near:    Bilateral Near:     Physical Exam  GEN: alert, well appearing male, in no acute distress  EYES: no scleral injection or discharge, EOM intact, PERRLA HENT:  left temporal edema encroaching on left upper and lower eyelids, left cheek edema, moist mucus membranes, no uvula swelling   CV: bradycardic (not new) RESP: no increased work of breathing MSK: no extremity edema  NEURO: alert, moves all extremities appropriately SKIN: warm and dry; no erythema,  see EYES and HENT exam above    UC Treatments / Results  Labs (all labs ordered are listed, but only abnormal results are displayed) Labs Reviewed - No data to display  EKG   Radiology No results found.  Procedures Procedures (including critical care time)  Medications Ordered in UC Medications  dexamethasone  (DECADRON ) injection 10 mg (10 mg Intramuscular Given 12/16/23 1957)  diphenhydrAMINE  (BENADRYL ) capsule 25 mg (25 mg Oral Given 12/16/23 1957)    Initial Impression / Assessment and Plan / UC Course  I have  reviewed the triage vital signs and the nursing notes.  Pertinent labs & imaging results that were available during my care of the patient were reviewed by me and considered in my medical decision making (see chart for details).  Patient is a 73 y.o. male who presents for worsening left facial swelling after being stung by wasps a few hours ago.  Overall, patient is well-appearing and well-hydrated.  Vital signs stable, his bradycardia is not new.  Kalyn Dimattia is afebrile.  Discussed steroids po, topical and IM and pt agreeable.  Decadron  10 mg IM given.  Treat with prednisone  taper and steroid ointment. Zyrtec  twice a day for additional allergy/itch relief. No EPI pen needed at this time.    Reviewed expectations regarding course of current medical issues.  All questions asked were answered.  Outlined signs and symptoms indicating need for more acute intervention. Patient verbalized understanding. After Visit Summary given.   Final Clinical Impressions(s) / UC Diagnoses   Final diagnoses:  Wasp sting, accidental or unintentional, initial encounter  Eye swelling, left  Facial swelling     Discharge Instructions      Use Zyrtec /Cetrizine twice a day for your allergic reaction. Start taking the prednisone  tomorrow. Apply the steroid ointment to the side of your face twice a day for 5 days as needed. You can take Benadryl  at bedtime to help with sleep.   Apply warm compresses to help with swelling. Follow up with your eye doctor this week as scheduled.        ED Prescriptions     Medication Sig Dispense Auth. Provider   triamcinolone  ointment (KENALOG ) 0.1 % Apply 1 Application topically 2 (two) times daily. 30 g Cherolyn Behrle, DO   predniSONE  (STERAPRED UNI-PAK 21 TAB) 10 MG (21) TBPK tablet Take by mouth daily. Take 6 tabs by mouth daily for 1, then 5 tabs for 1 day, then 4 tabs for 1 day, then 3 tabs for 1 day, then 2 tabs for 1 day, then 1 tab for 1 day. 21 tablet  Yulianna Folse, DO   cetirizine  (ZYRTEC ) 10 MG tablet Take 1 tablet (10 mg total) by mouth 2 (two) times daily as needed for allergies. 30 tablet Trinnity Breunig, DO      PDMP not reviewed this encounter.           Talal Fritchman, DO 12/18/23 [phone]

## 2023-12-16 NOTE — ED Triage Notes (Signed)
 Pt was stung by a wasp to the left side of his face at 3pm today. He put an ice pack to the area and took ibuprofen.

## 2023-12-16 NOTE — Discharge Instructions (Addendum)
 Use Zyrtec /Cetrizine twice a day for your allergic reaction. Start taking the prednisone  tomorrow. Apply the steroid ointment to the side of your face twice a day for 5 days as needed. You can take Benadryl  at bedtime to help with sleep.   Apply warm compresses to help with swelling. Follow up with your eye doctor this week as scheduled.

## 2024-04-25 ENCOUNTER — Other Ambulatory Visit: Payer: Self-pay | Admitting: Family Medicine

## 2024-04-25 DIAGNOSIS — Z0000 Encounter for general adult medical examination without abnormal findings: Secondary | ICD-10-CM

## 2024-06-15 ENCOUNTER — Ambulatory Visit
Admission: RE | Admit: 2024-06-15 | Discharge: 2024-06-15 | Disposition: A | Discharge status: Home / Self Care | Source: Ambulatory Visit | Attending: Family Medicine | Admitting: Family Medicine

## 2024-06-15 DIAGNOSIS — Z13820 Encounter for screening for osteoporosis: Secondary | ICD-10-CM | POA: Insufficient documentation

## 2024-06-15 DIAGNOSIS — Z0000 Encounter for general adult medical examination without abnormal findings: Secondary | ICD-10-CM | POA: Diagnosis present

## 2024-06-15 DIAGNOSIS — M8580 Other specified disorders of bone density and structure, unspecified site: Secondary | ICD-10-CM | POA: Insufficient documentation

## 2024-06-29 ENCOUNTER — Ambulatory Visit

## 2024-06-29 ENCOUNTER — Ambulatory Visit: Admitting: Physician Assistant

## 2024-06-29 ENCOUNTER — Encounter: Payer: Self-pay | Admitting: Physician Assistant

## 2024-06-29 VITALS — BP 116/80 | Ht 67.0 in | Wt 251.4 lb

## 2024-06-29 DIAGNOSIS — G8929 Other chronic pain: Secondary | ICD-10-CM

## 2024-06-29 DIAGNOSIS — M5442 Lumbago with sciatica, left side: Secondary | ICD-10-CM

## 2024-07-01 ENCOUNTER — Encounter: Payer: Self-pay | Admitting: Physician Assistant

## 2024-07-08 ENCOUNTER — Other Ambulatory Visit
